# Patient Record
Sex: Male | Born: 1985 | Hispanic: No | Marital: Single | Smoking: Current every day smoker
Health system: Southern US, Community
[De-identification: ages and names within clinical notes are randomized; demographics above are authoritative.]

## PROBLEM LIST (undated history)

## (undated) DIAGNOSIS — R569 Unspecified convulsions: Secondary | ICD-10-CM

## (undated) DIAGNOSIS — S069X9A Unspecified intracranial injury with loss of consciousness of unspecified duration, initial encounter: Secondary | ICD-10-CM

## (undated) DIAGNOSIS — S069XAA Unspecified intracranial injury with loss of consciousness status unknown, initial encounter: Secondary | ICD-10-CM

## (undated) DIAGNOSIS — G40909 Epilepsy, unspecified, not intractable, without status epilepticus: Secondary | ICD-10-CM

## (undated) DIAGNOSIS — F209 Schizophrenia, unspecified: Secondary | ICD-10-CM

## (undated) HISTORY — DX: Epilepsy, unspecified, not intractable, without status epilepticus: G40.909

## (undated) HISTORY — DX: Unspecified intracranial injury with loss of consciousness status unknown, initial encounter: S06.9XAA

## (undated) HISTORY — DX: Schizophrenia, unspecified: F20.9

## (undated) HISTORY — DX: Unspecified intracranial injury with loss of consciousness of unspecified duration, initial encounter: S06.9X9A

## (undated) HISTORY — DX: Unspecified convulsions: R56.9

---

## 2010-03-01 ENCOUNTER — Emergency Department: Payer: Self-pay | Admitting: Emergency Medicine

## 2010-06-06 ENCOUNTER — Encounter: Payer: Self-pay | Admitting: Family Medicine

## 2010-06-06 ENCOUNTER — Ambulatory Visit: Payer: Self-pay | Admitting: Family Medicine

## 2010-06-06 DIAGNOSIS — F209 Schizophrenia, unspecified: Secondary | ICD-10-CM

## 2010-07-24 ENCOUNTER — Ambulatory Visit: Payer: Self-pay | Admitting: Family Medicine

## 2010-07-24 DIAGNOSIS — J189 Pneumonia, unspecified organism: Secondary | ICD-10-CM | POA: Insufficient documentation

## 2010-07-26 ENCOUNTER — Telehealth: Payer: Self-pay | Admitting: Family Medicine

## 2010-07-31 ENCOUNTER — Emergency Department (HOSPITAL_COMMUNITY): Admission: EM | Admit: 2010-07-31 | Discharge: 2010-07-31 | Payer: Self-pay | Admitting: Family Medicine

## 2010-08-03 ENCOUNTER — Encounter: Payer: Self-pay | Admitting: *Deleted

## 2010-08-03 ENCOUNTER — Ambulatory Visit: Payer: Self-pay | Admitting: Family Medicine

## 2010-08-15 ENCOUNTER — Encounter: Payer: Self-pay | Admitting: Family Medicine

## 2010-08-15 ENCOUNTER — Ambulatory Visit: Payer: Self-pay | Admitting: Family Medicine

## 2010-08-15 LAB — CONVERTED CEMR LAB
ALT: 36 units/L (ref 0–53)
Albumin: 4.5 g/dL (ref 3.5–5.2)
Alkaline Phosphatase: 92 units/L (ref 39–117)
Glucose, Bld: 82 mg/dL (ref 70–99)
MCHC: 32.6 g/dL (ref 30.0–36.0)
MCV: 92.6 fL (ref 78.0–100.0)
Platelets: 324 10*3/uL (ref 150–400)
Potassium: 4.3 meq/L (ref 3.5–5.3)
RBC: 5.03 M/uL (ref 4.22–5.81)
RDW: 12.7 % (ref 11.5–15.5)
Sodium: 143 meq/L (ref 135–145)
Total Bilirubin: 0.3 mg/dL (ref 0.3–1.2)
Total Protein: 7.5 g/dL (ref 6.0–8.3)

## 2010-08-16 ENCOUNTER — Telehealth: Payer: Self-pay | Admitting: Family Medicine

## 2010-08-16 ENCOUNTER — Encounter: Payer: Self-pay | Admitting: Family Medicine

## 2010-11-05 ENCOUNTER — Ambulatory Visit: Admission: RE | Admit: 2010-11-05 | Discharge: 2010-11-05 | Payer: Self-pay | Source: Home / Self Care

## 2010-11-05 DIAGNOSIS — Z87820 Personal history of traumatic brain injury: Secondary | ICD-10-CM | POA: Insufficient documentation

## 2010-11-13 NOTE — Progress Notes (Signed)
Summary: refill  Phone Note Refill Request Call back at Home Phone [phone]   Refills Requested: Medication #1:  TYLENOL 325 MG TABS take one to two tabs every 6 hours as needed for pain or fever  Medication #2:  VICKS DAYQUIL MULTI-SYMPTOM 10-5-325 MG CAPS take as directed on package.  needs to be on the as needed list [facility]  Initial call taken by: De Nurse,  July 26, 2010 12:06 PM    New/Updated Medications: VICKS DAYQUIL SINUS 5-325 MG CAPS (PHENYLEPHRINE-ACETAMINOPHEN)  TYLENOL 325 MG TABS (ACETAMINOPHEN) 1-2 tabs every 6 hours as needed for pain

## 2010-11-13 NOTE — Miscellaneous (Signed)
Summary: ROI  ROI   Imported By: Clydell Hakim 06/06/2010 16:54:21  _____________________________________________________________________  External Attachment:    Type:   Image     Comment:   External Document

## 2010-11-13 NOTE — Assessment & Plan Note (Signed)
Summary: follow up from urgent care, see note /ls   Vital Signs:  Patient profile:   25 year old male Height:      63 inches Weight:      134 pounds BMI:     23.82 Temp:     97.7 degrees F oral Pulse rate:   79 / minute BP sitting:   106 / 69  (left arm) Cuff size:   regular  Vitals Entered By: Jimmy Footman, CMA (August 03, 2010 1:57 PM) CC: hospital f/u, productive cough/ fluid on lungs Is Patient Diabetic? No   Primary Care Kathaleen Dudziak:  Renold Don MD  CC:  hospital f/u and productive cough/ fluid on lungs.  History of Present Illness: 1) Pneumonia: Seen on 10/11 with nasal congestion and cough x 3 days w/o fever, dyspnea, nausea, vomiting or diarrhea and with reassuring respiratory exam - diagnosed with URI. Symptoms worsened over the course of the next week - patient developed fever to 101F, worsening non-productive cough, purulent nasal dischare, myalgia, sore throat - seen at Urgent Care on 10/18, CXR showed RML and possible lingular PNA - treated with single dose ceftriaxone and 10 day course of Azithromycin. Also with Guiatuss, APAP as needed. Symptoms have improved somewhat since that evaluation, though still has deep cough. (fever has resolved completely and breathing is back to normal)   Denies: wheeze, rash, nausea, vomiting or diarrhea, lethargy   Med rec as below and also GUAITUSS as needed   Current Medications (verified): 1)  Zonegran 100 Mg Caps (Zonisamide) .... 2 Tabs in Am, 3 Tabs in Pm Daily - Managed By [facility] Neurologist 2)  Keppra 100 Mg/ml Soln (Levetiracetam) .... 300 Mg By Mouth At Bedtime - Managed By [facility] Neurology 3)  Propranolol Hcl 40 Mg Tabs (Propranolol Hcl) .Marland Kitchen.. 1 Tab By Mouth Daily - Provided By Dr. Lerry Liner [facility] Neurology 4)  Latuda 80 Mg Tabs (Lurasidone Hcl) .Marland Kitchen.. 1 Tab By Mouth At Bedtime - Managed By [facility] Neurology 5)  Tylenol 325 Mg Tabs (Acetaminophen) .... Take One To Two Tabs Every 6 Hours As Needed For Pain or Fever 6)   Azithromycin 250 Mg Tabs (Azithromycin) .... One Tab By Mouth Daily X 9 Days  Allergies (verified): No Known Drug Allergies  Physical Exam  General:  Vital signs reviewed. Well-developed, well-nourished patient in NAD.  Awake and cooperative  Head:  normocephalic and atraumatic.   Eyes:  no conjunctivitis  Ears:  R ear normal and L ear normal.   Nose:  cleft palate repaired- nasal deviation.  rhinorrhea and nasal congestion present. Mouth:  Oral mucosa and oropharynx without lesions or exudates.  Neck:  Supple, full range of motion, no goiters or cervical lymphadenopathy noted  Lungs:  clear to auscultation bilaterally without wheezing, rales, or rhonchi.  Normal work of breathing  Heart:  Regular rate and rhythm without murmur, rub, or gallop.  Normal S1/S2  Abdomen:  soft/nondistended/nontender.  Bowel sounds present and normoactive.  No organomegaly noted.     Impression & Recommendations:  Problem # 1:  PNEUMONIA, RIGHT MIDDLE LOBE (ICD-486) Assessment New  Symptoms improving. Continue azithro as below. Patient s/op ceftriaxone x 1 dose at Urgent Care as well. Symptomatic treatment and red flags reviewed as well. Follow up with PCP as scheduled.   His updated medication list for this problem includes:    Azithromycin 250 Mg Tabs (Azithromycin) ..... One tab by mouth daily x 9 days  Orders: [facility]- Est Level  3 ([address])  Complete Medication List: 1)  Zonegran 100 Mg Caps (Zonisamide) .... 2 tabs in am, 3 tabs in pm daily - managed by [facility] neurologist 2)  Keppra 100 Mg/ml Soln (Levetiracetam) .... 300 mg by mouth at bedtime - managed by [facility] neurology 3)  Propranolol Hcl 40 Mg Tabs (Propranolol hcl) .Marland Kitchen.. 1 tab by mouth daily - provided [facility] 4)  Latuda 80 Mg Tabs (Lurasidone hcl) .Marland Kitchen.. 1 tab by mouth at bedtime - managed by [facility] neurology 5)  Tylenol 325 Mg Tabs (Acetaminophen) .... Take one to two tabs every 6 hours as needed for  pain or fever 6)  Azithromycin 250 Mg Tabs (Azithromycin) .... One tab by mouth daily x 9 days   Orders Added: 1)  [facility]- Est Level  3 [[address]]

## 2010-11-13 NOTE — Assessment & Plan Note (Signed)
Summary: CHEST CONGESTION X [facility]   Vital Signs:  Patient profile:   25 year old male Weight:      136.8 pounds Temp:     99.1 degrees F oral Pulse rate:   78 / minute Pulse rhythm:   regular BP sitting:   106 / 68  (left arm) Cuff size:   regular  Vitals Entered By: Loralee Pacas CMA (July 24, 2010 10:28 AM) CC: chest congestion   Primary Care Hawkins Seaman:  Renold Don MD  CC:  chest congestion.  History of Present Illness: 25 yo here for workin.  patient lives in a group home and they need a prescription in order to administer an medications  has had nasal congestion for 3 days as his most bothersome symptom.  Also with "scratchy throat", cough.  No fever, dyspnea, ear pain, n/v/d.   Habits & Providers  Alcohol-Tobacco-Diet     Tobacco Status: current     Tobacco Counseling: to quit use of tobacco products     Cigarette Packs/Day: 1.0  Current Medications (verified): 1)  Zonegran 100 Mg Caps (Zonisamide) .... 2 Tabs in Am, 3 Tabs in Pm Daily - Managed By [facility] Neurologist 2)  Keppra 100 Mg/ml Soln (Levetiracetam) .... 300 Mg By Mouth At Bedtime - Managed By [facility] Neurology 3)  Propranolol Hcl 40 Mg Tabs (Propranolol Hcl) .Marland Kitchen.. 1 Tab By Mouth Daily - Provided By Dr. Lerry Liner [facility] Neurology 4)  Latuda 80 Mg Tabs (Lurasidone Hcl) .Marland Kitchen.. 1 Tab By Mouth At Bedtime - Managed By [facility] Neurology 5)  Tylenol 325 Mg Tabs (Acetaminophen) .... Take One To Two Tabs Every 6 Hours As Needed For Pain or Fever 6)  Vicks Dayquil Multi-Symptom 10-5-325 Mg Caps (Dm-Phenylephrine-Acetaminophen) .... Take As Directed On Package  Allergies: No Known Drug Allergies PMH-FH-SH reviewed for relevance  Review of Systems      See HPI  Physical Exam  General:  Vital signs reviewed. Well-developed, well-nourished patient in NAD.  Awake and cooperative  Nose:  cleft palate repaired- nasal deviation.  rhinorrhea and nasal congestion present. Mouth:  Oral mucosa and  oropharynx without lesions or exudates.  Lungs:  clear to auscultation bilaterally without wheezing, rales, or rhonchi.  Normal work of breathing  Heart:  Regular rate and rhythm without murmur, rub, or gallop.  Normal S1/S2    Impression & Recommendations:  Problem # 1:  VIRAL URI (ICD-465.9) URI- symptomatic treatment.  Placed OTC meds on his medication list temporatily so he can be administered these in the group home.  Given handout on care of cough and cold, nasal saline for nasal congestion.  His updated medication list for this problem includes:    Tylenol 325 Mg Tabs (Acetaminophen) .Marland Kitchen... Take one to two tabs every 6 hours as needed for pain or fever    Vicks Dayquil Multi-symptom 10-5-325 Mg Caps (Dm-phenylephrine-acetaminophen) .Marland Kitchen... Take as directed on package  Complete Medication List: 1)  Zonegran 100 Mg Caps (Zonisamide) .... 2 tabs in am, 3 tabs in pm daily - managed by [facility] neurologist 2)  Keppra 100 Mg/ml Soln (Levetiracetam) .... 300 mg by mouth at bedtime - managed by [facility] neurology 3)  Propranolol Hcl 40 Mg Tabs (Propranolol hcl) .Marland Kitchen.. 1 tab by mouth daily - provided [facility] 4)  Latuda 80 Mg Tabs (Lurasidone hcl) .Marland Kitchen.. 1 tab by mouth at bedtime - managed by [facility] neurology 5)  Tylenol 325 Mg Tabs (Acetaminophen) .... Take one to  two tabs every 6 hours as needed for pain or fever 6)  Vicks Dayquil Multi-symptom 10-5-325 Mg Caps (Dm-phenylephrine-acetaminophen) .... Take as directed on package  Patient Instructions: 1)  See handout on cough and cold 2)  Return if not getting better in 1 week or start running fever 101.  Cough though may last even after youre better. Prescriptions: TYLENOL 325 MG TABS (ACETAMINOPHEN) take one to two tabs every 6 hours as needed for pain or fever  #30 x 0   Entered and Authorized by:   Delbert Harness MD   Signed by:   Delbert Harness MD on 07/24/2010   Method used:   Historical   RxID:    [id number]
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
## 2010-11-13 NOTE — Miscellaneous (Signed)
Summary: triage call  Clinical Lists Changes   received call from  group home caregiver and he states patient was seen in urgent care 07/31/2010 and was advised he had fluid in both lungs, was  given antibiotic injection and started on oral antibiotic and cough med. was told to follow up with PCP in 48 hours . appointment schedule today for recheck. Theresia Lo RN  August 03, 2010 11:15 AM

## 2010-11-13 NOTE — Letter (Signed)
Summary: Results Follow-up Letter  [facility]  [address]   [place], [place] [address]   Phone: [phone]  Fax: [phone]    08/16/2010  [address] [place], [place]  [address]  Dear Donald Novak,   All of your lab tests have returned and are completely normal.  We did not find any reason that you should be having increased seizures as far as liver or kidney function is concerned.  I still recommend you contact your Neurologist for further recommendations.  The following are the results of your recent test(s):    Sodium                    143 mEq/L                   135-145   Potassium                 4.3 mEq/L                   3.5-5.3   Chloride                  109 mEq/L                   96-112   CO2                       24 mEq/L                    19-32   Glucose                   82 mg/dL                    57-84   BUN                       15 mg/dL                    6-96   Creatinine                1.11 mg/dL                  0.40-1.50   Bilirubin, Total          0.3 mg/dL                   2.9-5.2   Alkaline Phosphatase      92 U/L                      39-117   AST/SGOT                  20 U/L                      0-37   ALT/SGPT                  36 U/L                      0-53   Total Protein             7.5 g/dL                    8.4-1.3   Albumin  4.5 g/dL                    1.6-1.0   Calcium                   9.5 mg/dL                   9.6-04.5 ! Est GFR, African American                             >60 mL/min                  >60 ! Est GFR, NonAfrican American                             >60 mL/min                  >60  Tests: (2) CBC NO Diff (Complete Blood Count) (10000)   WBC                       5.4 K/uL                    4.0-10.5   RBC                       5.03 MIL/uL                 4.22-5.81   Hemoglobin                15.2 g/dL                   40.9-81.1   Hematocrit                46.6 %                       39.0-52.0   MCV                       92.6 fL                     78.0-100.0 ! MCH                       30.2 pg                     26.0-34.0   MCHC                      32.6 g/dL                   91.4-78.2   RDW                       12.7 %                      11.5-15.5   Platelet Count            324 K/uL                    150-400   _________________________________________________________  Sincerely,  Renold Don MD [facility]  Appended Document: Results Follow-up Letter mailed

## 2010-11-13 NOTE — Progress Notes (Signed)
Summary: phn msg  Phone Note Call from Patient Call back at [phone]   Caller: Lucia Gaskins Summary of Call: wants Dr Gwendolyn Grant to call Dr Marcello Moores - neurology about his meds -pls call asap  Initial call taken by: De Nurse,  August 16, 2010 2:27 PM  Follow-up for Phone Call        I discussed with them at the visit that caretaker for patient needs to call and make appointment with Dr. Marcello Moores, patient's neurologist.  He needs to be seen by Dr. Marcello Moores asap, not by me.  I saw him in clinic, ran lab tests, which all came back normal, but if any more specific tests need to be done, they need to be done by and uder the supervision of Dr. Marcello Moores.   Follow-up by: Renold Don MD,  August 16, 2010 5:02 PM  Additional Follow-up for Phone Call Additional follow up Details #1::        called and gave Mr Amie Critchley message.  he wanted Dr Gwendolyn Grant to talk to Dr Marcello Moores because Marcello Moores wants to up the meds for him for his seizures - he can't talk to Dr France Ravens office b/c he doesn't have the authority to talk to him.  He wants Dr Gwendolyn Grant to monitor his meds instead.   Dr Marcello Moores # is -[phone] Additional Follow-up by: De Nurse,  August 21, 2010 11:13 AM    Additional Follow-up for Phone Call Additional follow up Details #2::    Called and spoke with Caretaker regarding this.  Arvine has appt on 11/22 for a follow up for his seizure medications.  Patint is to keep this appt.   Follow-up by: Renold Don MD,  August 21, 2010 12:38 PM

## 2010-11-13 NOTE — Letter (Signed)
Summary: Generic Letter  [facility]  [address]   [place], [place] [address]   Phone: [phone]  Fax: [phone]    08/03/2010   To whom it may concern:  Donald Novak was seen in the office office today due to medical reasons. If you have any questions please feel free to call our office.    Sincerely,   Jimmy Footman, CMA
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
## 2010-11-15 NOTE — Assessment & Plan Note (Signed)
Summary: f/u eo   Vital Signs:  Patient profile:   25 year old male Height:      63 inches Weight:      143 pounds Temp:     98.4 degrees F oral Pulse rate:   57 / minute Pulse rhythm:   regular BP sitting:   97 / 61  (left arm) Cuff size:   regular CC: follow-up visit Is Patient Diabetic? No   Primary Care Provider:  Renold Don MD  CC:  follow-up visit.  History of Present Illness: 1.  FU seizure disorder:  Patient doing well since last visit.  No further seizures.  Seen by neurologist who increased Keppra and added Tenex to medication regimen.  Patient also has VNS and new magnet for stimulator has been ordered.    No other concerns per caretaker or patient.  He has otherwise been well.      Habits & Providers  Alcohol-Tobacco-Diet     Tobacco Status: current     Tobacco Counseling: to quit use of tobacco products     Cigarette Packs/Day: 1.0  Exercise-Depression-Behavior     Seat Belt Use: always  Current Problems (verified): 1)  Personal History of Traumatic Brain Injury  (ICD-V15.52) 2)  Pneumonia, Right Middle Lobe  (ICD-486) 3)  Schizophrenia  (ICD-295.90) 4)  Seizures, Hx of  (ICD-V12.49)  Current Medications (verified): 1)  Zonegran 100 Mg Caps (Zonisamide) .... 2 Tabs in Am, 3 Tabs in Pm Daily - Managed By [facility] Neurologist 2)  Keppra 500 Mg Tabs (Levetiracetam) 3)  Propranolol Hcl 40 Mg Tabs (Propranolol Hcl) .Marland Kitchen.. 1 Tab By Mouth Daily - Provided By Dr. Lerry Liner [facility] Neurology 4)  Latuda 80 Mg Tabs (Lurasidone Hcl) .Marland Kitchen.. 1 Tab By Mouth At Bedtime - Managed By [facility] Neurology 5)  Tylenol 325 Mg Tabs (Acetaminophen) .... Take One To Two Tabs Every 6 Hours As Needed For Pain or Fever 6)  Tenex 1 Mg Tabs (Guanfacine Hcl) .... Provided By Neurologist  Allergies (verified): No Known Drug Allergies  Social History: Risk analyst Use:  always  Review of Systems       ROS:  no headaches, pre-syncopal or syncopal episodes, chest pain, palpitations,  shortness of breath or dyspnea, abdominal pain, diarrhea or constipation, melena, hematochezia, lower extremity swelling.    Physical Exam  General:  Vital signs reviewed. Well-developed, well-nourished patient in NAD.  Awake and cooperative  Eyes:  PERRL.  EOMI.   Lungs:  clear to auscultation bilaterally without wheezing, rales, or rhonchi.  Normal work of breathing  Heart:  Regular rate and rhythm without murmur, rub, or gallop.  Normal S1/S2  Neurologic:  CN II-XII intact.  Sensation intact throughout.  Patient with decreased Left sided strength at baseline both upper and lower extremities, 4/5.     Impression & Recommendations:  Problem # 1:  SEIZURES, HX OF (ICD-V12.49) Assessment Improved Managed by neurologist.  New medications added to list.  Currently stable.   Orders: [facility]- Est Level  3 (11914)  Problem # 2:  SCHIZOPHRENIA (ICD-295.90) Assessment: Comment Only Currently stable.    Complete Medication List: 1)  Zonegran 100 Mg Caps (Zonisamide) .... 2 tabs in am, 3 tabs in pm daily - managed by [facility] neurologist 2)  Keppra 500 Mg Tabs (Levetiracetam) 3)  Propranolol Hcl 40 Mg Tabs (Propranolol hcl) .Marland Kitchen.. 1 tab by mouth daily - provided [facility] 4)  Latuda 80 Mg Tabs (Lurasidone hcl) .Marland Kitchen.. 1 tab by  mouth at bedtime - managed by [facility] neurology 5)  Tylenol 325 Mg Tabs (Acetaminophen) .... Take one to two tabs every 6 hours as needed for pain or fever 6)  Tenex 1 Mg Tabs (Guanfacine hcl) .... Provided by neurologist   Orders Added: 1)  [facility]- Est Level  3 [16109]

## 2010-11-23 ENCOUNTER — Encounter: Payer: Self-pay | Admitting: *Deleted

## 2010-11-27 ENCOUNTER — Ambulatory Visit (INDEPENDENT_AMBULATORY_CARE_PROVIDER_SITE_OTHER): Payer: Medicaid Other | Admitting: Family Medicine

## 2010-11-27 DIAGNOSIS — F209 Schizophrenia, unspecified: Secondary | ICD-10-CM

## 2010-11-27 DIAGNOSIS — G40909 Epilepsy, unspecified, not intractable, without status epilepticus: Secondary | ICD-10-CM

## 2010-11-27 NOTE — Patient Instructions (Signed)
Thanks for coming in today.  I do not think that drug levels today will help much.  See your neurologist.  Make an appointment with your psychiatrist to establish captaincy.  See. Dr Gwendolyn Grant (here) after your neurologist visit.
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
## 2010-11-27 NOTE — Assessment & Plan Note (Signed)
Advised to see psych to eval for POA and capacity and competency.  Will f/u with Dr. Gwendolyn Grant to ensure that this is being done.

## 2010-11-27 NOTE — Assessment & Plan Note (Signed)
New seizure last week.  Had seizure  Last about 1.5 months ago.  He needs a f/u visit with neuro. March 9th will be OK.  No new neuro exam changes to my exam today.  No new meds. I do not think a drug level will be helpful today.  Plan: F/u after neuro visit. Gave red flags.

## 2011-01-17 ENCOUNTER — Ambulatory Visit: Payer: Medicaid Other | Admitting: Family Medicine

## 2011-03-25 ENCOUNTER — Ambulatory Visit: Payer: Medicaid Other | Admitting: Family Medicine

## 2011-05-07 ENCOUNTER — Encounter: Payer: Self-pay | Admitting: Family Medicine

## 2011-05-07 ENCOUNTER — Ambulatory Visit (INDEPENDENT_AMBULATORY_CARE_PROVIDER_SITE_OTHER): Payer: Medicaid Other | Admitting: Family Medicine

## 2011-05-07 VITALS — BP 107/74 | HR 63 | Temp 97.7°F | Ht 63.0 in | Wt 178.5 lb

## 2011-05-07 DIAGNOSIS — Z87820 Personal history of traumatic brain injury: Secondary | ICD-10-CM

## 2011-05-07 NOTE — Patient Instructions (Signed)
Donald Novak was seen in my clinic today.  He is doing well and I will see him back in 4-6 months.  If you have questions please call our office.

## 2011-05-12 ENCOUNTER — Encounter: Payer: Self-pay | Admitting: Family Medicine

## 2011-05-12 NOTE — Assessment & Plan Note (Signed)
No changes to mental status. I have suggested that Donald Novak can spread out his visits but his group home manager insists on bringing him every 3 months to be checked out.   Psychiatrist manages all of his medications.  Will continue to provide physical exam and review of symptoms upon request.

## 2011-05-12 NOTE — Progress Notes (Signed)
  Subjective:    Patient ID: Donald Novak, male    DOB: 09/06/1986, 25 y.o.   MRN: [id number]  HPI Donald Novak is a 25 yo Male who is a resident of [facility] group home who returns today for regular 3 month "check-up."  No changes to his medications or any new complaints.  He suffers from TBI, seizure disorder, and schizophrenia.  His medications are managed by a psychiatrist in [place].  No halluncinations, seizures, changes in personality.     Review of Systems The patient denies fever, unusual weight change, decreased hearing, chest pain, palpitations, pre-syncopal or syncopal episodes, dyspnea on exertion, prolonged cough, hemoptysis, change in bowel habits, melena, hematochezia, severe indigestion/heartburn, nausea/vomiting/abdominal pain, genital sores, muscle weakness, difficulty walking, abnormal bleeding, or enlarged lymph nodes.      Objective:   Physical Exam Gen:  Alert, cooperative patient who appears stated age in no acute distress.  Vital signs reviewed. HEENT:  [place]/AT.  EOMI, PERRL.  MMM, tonsils non-erythematous, non-edematous.  External ears WNL, Bilateral TM's normal without retraction, redness or bulging.  Neck: No masses or thyromegaly or limitation in range of motion.  No cervical lymphadenopathy. Cardiac:  Regular rate and rhythm without murmur auscultated.  Good S1/S2. Pulm:  Clear to auscultation bilaterally with good air movement.  No wheezes or rales noted.   Abd:  Soft/nondistended/nontender.  Good bowel sounds throughout all four quadrants.  No masses noted.  Ext:  No clubbing/cyanosis/erythema.  No edema noted bilateral lower extremities.   Alert and oriented to person, place, and date.  CN II-XII intact.  No focal deficits noted.         Assessment & Plan:   No problem-specific assessment & plan notes found for this encounter.

## 2011-05-14 ENCOUNTER — Other Ambulatory Visit: Payer: Self-pay | Admitting: Family Medicine

## 2011-05-14 MED ORDER — ZONISAMIDE 100 MG PO CAPS: 100.0000 mg | ORAL_CAPSULE | Freq: Every day | ORAL | Status: DC

## 2011-05-14 MED ORDER — PROPRANOLOL HCL 40 MG PO TABS: ORAL_TABLET | ORAL | Status: DC

## 2011-08-28 ENCOUNTER — Ambulatory Visit (INDEPENDENT_AMBULATORY_CARE_PROVIDER_SITE_OTHER): Payer: Medicaid Other | Admitting: Family Medicine

## 2011-08-28 ENCOUNTER — Encounter: Payer: Self-pay | Admitting: Family Medicine

## 2011-08-28 DIAGNOSIS — M216X9 Other acquired deformities of unspecified foot: Secondary | ICD-10-CM

## 2011-08-28 DIAGNOSIS — M549 Dorsalgia, unspecified: Secondary | ICD-10-CM

## 2011-08-28 DIAGNOSIS — M21379 Foot drop, unspecified foot: Secondary | ICD-10-CM | POA: Insufficient documentation

## 2011-08-28 MED ORDER — IBUPROFEN 200 MG PO TABS: 400.0000 mg | ORAL_TABLET | Freq: Four times a day (QID) | ORAL | Status: DC | PRN

## 2011-08-28 NOTE — Assessment & Plan Note (Signed)
AFO brace. Patient already seen at [facility] medical supply. Evidently his psychiatrist has provided him a prescription for ortho device. Recommend to continue use of this after it is properly fitted

## 2011-08-28 NOTE — Progress Notes (Signed)
  Subjective:    Patient ID: Donald Novak, male    DOB: 23-Feb-1986, 25 y.o.   MRN: [id number]  HPI 25 yo M here for 3 month check-up.  I have discussed with caregiver that Donald Novak needs to be seen every 6-12 months as he is currently stable from a medical perspective and his bipolar medication, traumatic brain injury, and seizure disorder handled by both the neurologist and psychiatrist. However his caretaker feels more comfortable having the patient seen every 3 months. Current complaints for today are listed below:  1.  Foot problem:  Abnormal gait. This is a long-term problem for patient. He's limped since at least age 35. Unclear etiology of the problem. Neuro foot pain. No paresthesias or numbness. States that he wore an AFO brace for the past but this has been rubbing on his leg and therefore he does know where this. He has been seen by different medical supply and has been told he can be fitted for a new brace. He should like to go forward with this  2.  Back pain:  Present for the past week or so. Describes midthoracic pain in the "middle of my back". Has taken Tylenol with some relief. No fevers or chills. No headaches. No incontinence of bowel or bladder.  Review of Systems See HPI above for review of systems.       Objective:   Physical Exam Gen:  Alert, cooperative patient who appears stated age in no acute distress.  Vital signs reviewed. Cardiac:  Regular rate and rhythm without murmur auscultated.  Good S1/S2. Pulm:  Clear to auscultation bilaterally with good air movement.  No wheezes or rales noted.   MSK:  SLR positive for lower back pain but only mild pain. Probable lumbar spasm noted left thoracic area. Extremity: Right foot drop noted on exam. Patient 2/5 strength plantar flexion 1/5 strength dorsiflexion. Neuro:  Wide swinging gait consistent with foot drop.       Assessment & Plan:

## 2011-08-28 NOTE — Assessment & Plan Note (Signed)
Secondary to uneven gait. Ibuprofen for relief.   Heat and massage therapy.

## 2011-12-16 ENCOUNTER — Ambulatory Visit: Payer: Medicaid Other | Admitting: Family Medicine

## 2011-12-30 ENCOUNTER — Ambulatory Visit: Payer: Medicaid Other | Admitting: Family Medicine

## 2012-01-06 ENCOUNTER — Ambulatory Visit: Payer: Medicaid Other | Admitting: Family Medicine

## 2012-03-31 ENCOUNTER — Ambulatory Visit: Payer: Medicaid Other | Admitting: Family Medicine

## 2012-04-10 ENCOUNTER — Ambulatory Visit: Payer: Medicaid Other | Admitting: Family Medicine

## 2012-04-30 ENCOUNTER — Encounter: Payer: Self-pay | Admitting: Family Medicine

## 2012-04-30 ENCOUNTER — Ambulatory Visit (INDEPENDENT_AMBULATORY_CARE_PROVIDER_SITE_OTHER): Payer: Medicaid Other | Admitting: Family Medicine

## 2012-04-30 ENCOUNTER — Other Ambulatory Visit (HOSPITAL_COMMUNITY)
Admission: RE | Admit: 2012-04-30 | Discharge: 2012-04-30 | Disposition: A | Discharge status: Home / Self Care | Payer: Medicaid Other | Source: Ambulatory Visit | Attending: Family Medicine | Admitting: Family Medicine

## 2012-04-30 VITALS — BP 105/72 | HR 57 | Temp 97.6°F | Ht 63.0 in | Wt 139.3 lb

## 2012-04-30 DIAGNOSIS — Z202 Contact with and (suspected) exposure to infections with a predominantly sexual mode of transmission: Secondary | ICD-10-CM

## 2012-04-30 DIAGNOSIS — Z2089 Contact with and (suspected) exposure to other communicable diseases: Secondary | ICD-10-CM

## 2012-04-30 DIAGNOSIS — Z113 Encounter for screening for infections with a predominantly sexual mode of transmission: Secondary | ICD-10-CM | POA: Insufficient documentation

## 2012-04-30 DIAGNOSIS — Z8669 Personal history of other diseases of the nervous system and sense organs: Secondary | ICD-10-CM

## 2012-04-30 DIAGNOSIS — F209 Schizophrenia, unspecified: Secondary | ICD-10-CM

## 2012-04-30 DIAGNOSIS — Z20828 Contact with and (suspected) exposure to other viral communicable diseases: Secondary | ICD-10-CM

## 2012-04-30 NOTE — Assessment & Plan Note (Signed)
Patient expressed desire for STD screening due to multiple sexual partners.  Will check RPR, HIV antibody, GC, and Chlamydia today and call with results.  Needs to be educated on safe sex practices.

## 2012-04-30 NOTE — Assessment & Plan Note (Signed)
Patient seems to be doing well.  Has enrolled in school.  No reported hallucinations. No changes in behavior.  Patient will continue current medications per Psychiatry.

## 2012-04-30 NOTE — Progress Notes (Signed)
Subjective:     Patient ID: Donald Novak, male   DOB: [birth year], 26 y.o.   MRN: [id number]  HPI 26 yo M here for followup and FL2 paperwork. He resides in a group home and is accompanied by his caregiver [name].  He has a PMH of bipolar disorder, traumatic brain injury, seizures, and schizophrenia.  He continues to be closely followed by his neurologist and psychiatrist.  Recent seizure activity in May 2013 - managed by neurology.    Today he has no problems or complaints.  He did however express that he STD testing today.  He reports 2-3 sexual partners and wants to be tested.  He did not report any discharge or problems with urination.  [name] and [place] report no new changes to medications.  [name] reports that Kingson recently enrolled at [facility] and is doing well.    Review of Systems Denies headache, nausea, vomiting. No chest pain, SOB.    Objective:   Physical Exam General:  Alert and oriented. NAD.  Appears stated age.  Flat affect. Heart:  RRR. No murmurs, rubs, or gallops. Chest:  CTAB.  No rales, rhonchi, or wheeze.  Abdomen:  Soft, nontender, nondistended. Extremities:  No cyanosis, clubbing, or edema.  Right upper extremity noticeably weak compared with the left.  Muscle strength 4/5 in right, 5/5 in left.    Assessment:         Plan:

## 2012-04-30 NOTE — Patient Instructions (Addendum)
It was nice to meet you today.  I will call with your test results.  If you don't hear from me in 1-2 weeks call the office.  Continue all of your current medications.    I will see you back for follow up in 3-6 months.

## 2012-04-30 NOTE — Assessment & Plan Note (Signed)
No seizure activity since May 2013.   Seizure meds per Neurologist.

## 2012-05-01 LAB — HIV ANTIBODY (ROUTINE TESTING W REFLEX): HIV: NONREACTIVE

## 2012-05-01 LAB — RPR

## 2012-05-05 ENCOUNTER — Telehealth: Payer: Self-pay | Admitting: Family Medicine

## 2012-05-05 NOTE — Telephone Encounter (Signed)
Called patient to give lab results 7/23 at 345 pm.  No answer. Left instructions to call clinic.  Will attempt to call again tomorrow.

## 2012-05-12 ENCOUNTER — Encounter: Payer: Self-pay | Admitting: Family Medicine

## 2012-05-21 ENCOUNTER — Encounter: Payer: Self-pay | Admitting: Family Medicine

## 2012-05-23 ENCOUNTER — Encounter (HOSPITAL_COMMUNITY): Payer: Self-pay | Admitting: *Deleted

## 2012-05-23 ENCOUNTER — Emergency Department (HOSPITAL_COMMUNITY)
Admission: EM | Admit: 2012-05-23 | Discharge: 2012-05-24 | Disposition: A | Discharge status: Home / Self Care | Payer: Medicaid Other | Attending: Emergency Medicine | Admitting: Emergency Medicine

## 2012-05-23 ENCOUNTER — Emergency Department (HOSPITAL_COMMUNITY): Payer: Medicaid Other

## 2012-05-23 DIAGNOSIS — R10819 Abdominal tenderness, unspecified site: Secondary | ICD-10-CM | POA: Insufficient documentation

## 2012-05-23 DIAGNOSIS — G40909 Epilepsy, unspecified, not intractable, without status epilepticus: Secondary | ICD-10-CM | POA: Insufficient documentation

## 2012-05-23 DIAGNOSIS — Z79899 Other long term (current) drug therapy: Secondary | ICD-10-CM | POA: Insufficient documentation

## 2012-05-23 DIAGNOSIS — R109 Unspecified abdominal pain: Secondary | ICD-10-CM | POA: Insufficient documentation

## 2012-05-23 DIAGNOSIS — Z87820 Personal history of traumatic brain injury: Secondary | ICD-10-CM | POA: Insufficient documentation

## 2012-05-23 DIAGNOSIS — N201 Calculus of ureter: Secondary | ICD-10-CM | POA: Insufficient documentation

## 2012-05-23 LAB — COMPREHENSIVE METABOLIC PANEL
ALT: 42 U/L (ref 0–53)
Albumin: 5.1 g/dL (ref 3.5–5.2)
Calcium: 9.9 mg/dL (ref 8.4–10.5)
GFR calc Af Amer: >90 mL/min (ref >90)
Glucose, Bld: 107 mg/dL — ABNORMAL HIGH (ref 70–99)
Sodium: 140 mEq/L (ref 135–145)
Total Protein: 8.7 g/dL — ABNORMAL HIGH (ref 6.0–8.3)

## 2012-05-23 LAB — URINALYSIS, ROUTINE W REFLEX MICROSCOPIC
Ketones, ur: NEGATIVE mg/dL
Nitrite: NEGATIVE
Protein, ur: NEGATIVE mg/dL
Urobilinogen, UA: 1 mg/dL (ref 0.0–1.0)

## 2012-05-23 LAB — CBC WITH DIFFERENTIAL/PLATELET
Basophils Relative: 0 % (ref 0–1)
Eosinophils Absolute: 0.9 10*3/uL — ABNORMAL HIGH (ref 0.0–0.7)
Eosinophils Relative: 5 % (ref 0–5)
Lymphs Abs: 2.5 10*3/uL (ref 0.7–4.0)
MCH: 31.3 pg (ref 26.0–34.0)
MCHC: 35.9 g/dL (ref 30.0–36.0)
MCV: 87.1 fL (ref 78.0–100.0)
Neutrophils Relative %: 73 % (ref 43–77)
Platelets: 274 10*3/uL (ref 150–400)
RBC: 5.72 MIL/uL (ref 4.22–5.81)
RDW: 12.1 % (ref 11.5–15.5)

## 2012-05-23 LAB — URINE MICROSCOPIC-ADD ON

## 2012-05-23 MED ORDER — ONDANSETRON 4 MG PO TBDP
4.0000 mg | ORAL_TABLET | Freq: Once | ORAL | Status: AC
  Administered 2012-05-23: 4 mg via ORAL

## 2012-05-23 MED ORDER — ONDANSETRON HCL 4 MG/2ML IJ SOLN
INTRAMUSCULAR | Status: AC
  Filled 2012-05-23: qty 2

## 2012-05-23 MED ORDER — ONDANSETRON 4 MG PO TBDP
ORAL_TABLET | ORAL | Status: AC
  Administered 2012-05-23: 4 mg via ORAL
  Filled 2012-05-23: qty 1

## 2012-05-23 MED ORDER — SODIUM CHLORIDE 0.9 % IV BOLUS (SEPSIS)
1000.0000 mL | Freq: Once | INTRAVENOUS | Status: AC
  Administered 2012-05-23: 1000 mL via INTRAVENOUS

## 2012-05-23 NOTE — ED Provider Notes (Addendum)
History     CSN: [id number]  Arrival date & time 05/23/12  2015   First MD Initiated Contact with Patient 05/23/12 2253      Chief Complaint  Patient presents with  . Abdominal Pain    (Consider location/radiation/quality/duration/timing/severity/associated sxs/prior treatment) HPI HX per PT. onset today of abdominal pain lower abdomen mostly left-sided, sharp in quality and not radiating. Has associated nausea vomiting diarrhea. Denies any fevers or chills. No trauma. No history of same. Has had some dark urine but did not notice blood in urine. Lives in a group home and has history of schizophrenia and traumatic brain injury. No reported seizure activity. pain was severe but now is mild to moderate. Past Medical History  Diagnosis Date  . Schizophrenia   . TBI (traumatic brain injury)   . Seizure disorder   . Seizures     History reviewed. No pertinent past surgical history.  No family history on file.  History  Substance Use Topics  . Smoking status: Current Everyday Smoker -- 0.5 packs/day for 10 years    Types: Cigarettes  . Smokeless tobacco: Not on file  . Alcohol Use: No      Review of Systems  Constitutional: Negative for fever and chills.  HENT: Negative for neck pain and neck stiffness.   Eyes: Negative for pain.  Respiratory: Negative for shortness of breath.   Cardiovascular: Negative for chest pain.  Gastrointestinal: Positive for nausea, vomiting, abdominal pain and diarrhea.  Genitourinary: Negative for dysuria.  Musculoskeletal: Negative for back pain.  Skin: Negative for rash.  Neurological: Negative for headaches.  All other systems reviewed and are negative.    Allergies  Review of patient's allergies indicates no known allergies.  Home Medications   Current Outpatient Rx  Name Route Sig Dispense Refill  . ACETAMINOPHEN 325 MG PO TABS Oral Take 325-650 mg by mouth every 6 (six) hours as needed. For pain/fever    . GUANFACINE HCL 1 MG  PO TABS Oral Take 1 mg by mouth 2 (two) times daily. Provided by neurologist    . IBUPROFEN 200 MG PO TABS Oral Take 400 mg by mouth every 6 (six) hours as needed. For pain    . LEVETIRACETAM 500 MG PO TABS Oral Take 2,500 mg by mouth at bedtime. NO INSTRUCTIONS GIVEN    . LURASIDONE HCL 80 MG PO TABS Oral Take 1 tablet by mouth at bedtime. Managed by [facility]rology    . PROPRANOLOL HCL 40 MG PO TABS Oral Take 120 mg by mouth daily.    Marland Kitchen ZONISAMIDE 100 MG PO CAPS Oral Take 200-300 mg by mouth 2 (two) times daily. 2 tabs in AM, 3 tabs in PM daily - managed by [facility] Meurologist - 500 mg total      BP 132/98  Pulse 48  Temp 97.9 F (36.6 C) (Oral)  Resp 20  SpO2 100%  Physical Exam  Constitutional: He is oriented to person, place, and time. He appears well-developed and well-nourished.  HENT:  Head: Normocephalic and atraumatic.  Eyes: Conjunctivae and EOM are normal. Pupils are equal, round, and reactive to light.  Neck: Trachea normal. Neck supple. No thyromegaly present.  Cardiovascular: Normal rate, regular rhythm, S1 normal, S2 normal and normal pulses.     No systolic murmur is present   No diastolic murmur is present  Pulses:      Radial pulses are 2+ on the right side, and 2+ on the left side.  Pulmonary/Chest: Effort  normal and breath sounds normal. He has no wheezes. He has no rhonchi. He has no rales. He exhibits no tenderness.  Abdominal: Soft. Normal appearance and bowel sounds are normal. He exhibits no mass. There is no CVA tenderness and negative Murphy's sign.       Tender left flank and left lower quadrant without rebound guarding or peritonitis  Musculoskeletal:       BLE:s Calves nontender, no cords or erythema, negative Homans sign  Neurological: He is alert and oriented to person, place, and time. He has normal strength. No cranial nerve deficit or sensory deficit. GCS eye subscore is 4. GCS verbal subscore is 5. GCS motor subscore is 6.  Skin: Skin is  warm and dry. No rash noted. He is not diaphoretic.  Psychiatric: His speech is normal.       Cooperative and appropriate    ED Course  Angiocath insertion Date/Time: 05/23/2012 11:19 PM Performed by: Sunnie Nielsen Authorized by: Sunnie Nielsen Consent: Verbal consent obtained. Risks and benefits: risks, benefits and alternatives were discussed Consent given by: patient Patient understanding: patient states understanding of the procedure being performed Patient consent: the patient's understanding of the procedure matches consent given Procedure consent: procedure consent matches procedure scheduled Required items: required blood products, implants, devices, and special equipment available Patient identity confirmed: verbally with patient Time out: Immediately prior to procedure a "time out" was called to verify the correct patient, procedure, equipment, support staff and site/side marked as required. Preparation: Patient was prepped and draped in the usual sterile fashion. Patient tolerance: Patient tolerated the procedure well with no immediate complications. Comments: 20 G angiocath inserted R EJ without difficulty.  Blood drew and and andback and saline injected.    (including critical care time)  Results for orders placed during the hospital encounter of 05/23/12  URINALYSIS, ROUTINE W REFLEX MICROSCOPIC      Component Value Range   Color, Urine AMBER (*) YELLOW   APPearance CLOUDY (*) CLEAR   Specific Gravity, Urine 1.030  1.005 - 1.030   pH 5.5  5.0 - 8.0   Glucose, UA NEGATIVE  NEGATIVE mg/dL   Hgb urine dipstick LARGE (*) NEGATIVE   Bilirubin Urine SMALL (*) NEGATIVE   Ketones, ur NEGATIVE  NEGATIVE mg/dL   Protein, ur NEGATIVE  NEGATIVE mg/dL   Urobilinogen, UA 1.0  0.0 - 1.0 mg/dL   Nitrite NEGATIVE  NEGATIVE   Leukocytes, UA NEGATIVE  NEGATIVE  CBC WITH DIFFERENTIAL      Component Value Range   WBC 16.5 (*) 4.0 - 10.5 K/uL   RBC 5.72  4.22 - 5.81 MIL/uL    Hemoglobin 17.9 (*) 13.0 - 17.0 g/dL   HCT 16.1  09.6 - 04.5 %   MCV 87.1  78.0 - 100.0 fL   MCH 31.3  26.0 - 34.0 pg   MCHC 35.9  30.0 - 36.0 g/dL   RDW 40.9  81.1 - 91.4 %   Platelets 274  150 - 400 K/uL   Neutrophils Relative 73  43 - 77 %   Neutro Abs 12.0 (*) 1.7 - 7.7 K/uL   Lymphocytes Relative 15  12 - 46 %   Lymphs Abs 2.5  0.7 - 4.0 K/uL   Monocytes Relative 7  3 - 12 %   Monocytes Absolute 1.1 (*) 0.1 - 1.0 K/uL   Eosinophils Relative 5  0 - 5 %   Eosinophils Absolute 0.9 (*) 0.0 - 0.7 K/uL   Basophils Relative 0  0 - 1 %   Basophils Absolute 0.1  0.0 - 0.1 K/uL  COMPREHENSIVE METABOLIC PANEL      Component Value Range   Sodium 140  135 - 145 mEq/L   Potassium 3.9  3.5 - 5.1 mEq/L   Chloride 106  96 - 112 mEq/L   CO2 22  19 - 32 mEq/L   Glucose, Bld 107 (*) 70 - 99 mg/dL   BUN 13  6 - 23 mg/dL   Creatinine, Ser 1.61  0.50 - 1.35 mg/dL   Calcium 9.9  8.4 - 09.6 mg/dL   Total Protein 8.7 (*) 6.0 - 8.3 g/dL   Albumin 5.1  3.5 - 5.2 g/dL   AST 28  0 - 37 U/L   ALT 42  0 - 53 U/L   Alkaline Phosphatase 94  39 - 117 U/L   Total Bilirubin 0.5  0.3 - 1.2 mg/dL   GFR calc non Af Amer 84 (*) >90 mL/min   GFR calc Af Amer >90  >90 mL/min  URINE MICROSCOPIC-ADD ON      Component Value Range   RBC / HPF TOO NUMEROUS TO COUNT  <3 RBC/hpf   Crystals CA OXALATE CRYSTALS (*) NEGATIVE   Urine-Other MUCOUS PRESENT     Ct Abdomen Pelvis Wo Contrast  05/24/2012  *RADIOLOGY REPORT*  Clinical Data: Abdominal pain, vomiting.  CT ABDOMEN AND PELVIS WITHOUT CONTRAST  Technique:  Multidetector CT imaging of the abdomen and pelvis was performed following the standard protocol without intravenous contrast.  Comparison: None.  Findings: Limited images through the lung bases demonstrate no significant appreciable abnormality. The heart size is within normal limits. No pleural or pericardial effusion.  Organ abnormality/lesion detection is limited in the absence of intravenous contrast. Within  this limitation, there is a subcentimeter hypodensity within the left hepatic lobe. Otherwise homogeneous hepatic and splenic attenuation.  Unremarkable biliary system, pancreas, adrenal glands.  Unremarkable right kidney.  The left kidney is edematous with mild hydroureteronephrosis to the level of a 3.5 mm distal left ureteral stone.  No bowel obstruction.  No CT evidence for colitis. Appendix measures upper normal at 8 mm however no periappendiceal inflammation.  No lymphadenopathy.  Normal caliber vasculature.  Partially decompressed bladder.  No acute osseous finding.  IMPRESSION: Mild left hydroureteronephrosis to the level of a 3.5 mm distal left ureteral stone.  Appendix is prominent, measuring 8 mm in diameter.  There is no periappendiceal inflammation.  Correlate clinically if concern for early/mild appendicitis.  Original Report Authenticated By: Waneta Martins, M.D.   Recheck after IVFs and zofran, toradol, is pain free and no tenderness or acute ABD on exam.   1:30 AM d/w [facility] on call DR Dwain Sarna as above, he reviewed CT, agrees with my impression/ doubts PT has appendicitis. no indication for admit at this time.    Abdominal pain with kidney stone as above. Exam presentation does not suggest early appendicitis. Patient is a reasonable historian and states understanding strict discharge and followup instructions as well as strict return precautions. Prescription for pain medications and antiemetics provided with urology referral. Stable for discharge home MDM   Nursing notes reviewed. Vital signs reviewed. Labs and imaging as above. CT scan reviewed. General surgery consult. UA reviewed as above. Improved condition and stable for discharge home.        Sunnie Nielsen, MD 05/24/12 0158    Date: 06/18/2012  Rate: 47  Rhythm: sinus bradycardia  QRS Axis: right  Intervals: normal  ST/T Wave abnormalities: nonspecific ST/T changes  Conduction Disutrbances:none  Narrative  Interpretation:   Old EKG Reviewed: none available    Sunnie Nielsen, MD 06/18/12 2304

## 2012-05-23 NOTE — ED Notes (Signed)
Pt lives in group home - caregiver at bedside. Pt and caregiver report pt has not been feeling well today, later began to have diffuse abd pain w/ x3-4 episodes of diarrhea as well. While ambulating to room pt vomiting large amts. Pt denies any known fever. Attempted IV access x2 - unable to obtain IV access. Pt given ODT zofran 4mg  per protocol for n/v. Pt A&Ox4 - in no acute distress.

## 2012-05-23 NOTE — ED Notes (Signed)
abd pain since 1700 today with diarrhea

## 2012-05-23 NOTE — ED Notes (Signed)
No bm for days he cannot remember when his last one was

## 2012-05-24 MED ORDER — HYDROCODONE-ACETAMINOPHEN 5-500 MG PO TABS: 2.0000 | ORAL_TABLET | Freq: Four times a day (QID) | ORAL | Status: AC | PRN

## 2012-05-24 MED ORDER — KETOROLAC TROMETHAMINE 30 MG/ML IJ SOLN
30.0000 mg | Freq: Once | INTRAMUSCULAR | Status: AC
  Administered 2012-05-24: 30 mg via INTRAVENOUS
  Filled 2012-05-24: qty 1

## 2012-05-24 MED ORDER — METOCLOPRAMIDE HCL 5 MG/ML IJ SOLN
10.0000 mg | Freq: Once | INTRAMUSCULAR | Status: AC
  Administered 2012-05-24: 10 mg via INTRAVENOUS
  Filled 2012-05-24: qty 2

## 2012-05-24 MED ORDER — ONDANSETRON HCL 4 MG PO TABS: 4.0000 mg | ORAL_TABLET | Freq: Four times a day (QID) | ORAL | Status: AC

## 2012-05-24 NOTE — ED Notes (Signed)
Rx given x2 Pt ambulating independently w/ steady gait on d/c in no acute distress, A&Ox4. D/c instructions reviewed w/ pt and caregiver - pt and caregiver deny any further questions or concerns at present.

## 2012-05-25 LAB — URINE CULTURE: Colony Count: 6000

## 2012-05-27 ENCOUNTER — Telehealth: Payer: Self-pay | Admitting: Family Medicine

## 2012-05-27 NOTE — Telephone Encounter (Signed)
Caregiver is calling to report that the patient was taken to the ER for stomach pain on 05/23/12.  He was diagnosed with a Kidney Stone and was prescribed medication for nausea and pain med.  The MD at that time said that the stone was close to passing, Yesterday his pain was getting really bad so the Caregiver gave him some pain meds and he was able to sleep through the night, but this morning he was in so much pain that he wasn't able to get out of bed for school.  As of this morning, he has not passed the stone.  The Caregiver would like to speak to Dr. Adriana Simas further about this as well.

## 2012-05-29 ENCOUNTER — Telehealth: Payer: Self-pay | Admitting: Family Medicine

## 2012-05-29 NOTE — Telephone Encounter (Signed)
I spoke with caregiver about Donald Novak kidney stone and pain.  I informed her that the treatment for kidney stones is primarily supportive with pain meds and aggressive hydration.  Advised her that patient should pass stone; if he continues to have severe pain without passing the stone in approximately 1 week he should seek medical attention.

## 2012-06-01 ENCOUNTER — Telehealth: Payer: Self-pay | Admitting: Family Medicine

## 2012-06-01 NOTE — Telephone Encounter (Signed)
[name] from the [facility] would like to speak to Dr. Adriana Simas about Donald Novak' Kidney Stone.  The patient left for his visit with family on Thursday and there is an Encounter where Dr. Adriana Simas spoke with a woman, but he wants to speak to Dr. Adriana Simas.

## 2012-06-02 ENCOUNTER — Telehealth: Payer: Self-pay | Admitting: Family Medicine

## 2012-06-02 NOTE — Telephone Encounter (Signed)
Spoke with Mr. Donald Novak the patients caregiver.  Encouraged good hydration and pain management.  Told caregiver if pain continues and is uncontrolled (or patient runs out of pain meds) to seek medical attention either at the clinic or in the ED.

## 2012-08-28 ENCOUNTER — Ambulatory Visit (INDEPENDENT_AMBULATORY_CARE_PROVIDER_SITE_OTHER): Payer: Medicaid Other | Admitting: Family Medicine

## 2012-08-28 ENCOUNTER — Encounter: Payer: Self-pay | Admitting: Family Medicine

## 2012-08-28 VITALS — BP 120/76 | HR 102 | Ht 63.0 in | Wt 137.2 lb

## 2012-08-28 DIAGNOSIS — Z3009 Encounter for other general counseling and advice on contraception: Secondary | ICD-10-CM

## 2012-08-28 DIAGNOSIS — F209 Schizophrenia, unspecified: Secondary | ICD-10-CM

## 2012-08-28 DIAGNOSIS — G40909 Epilepsy, unspecified, not intractable, without status epilepticus: Secondary | ICD-10-CM

## 2012-08-28 NOTE — Assessment & Plan Note (Signed)
Stable on Latuda.  Patient doing well.

## 2012-08-28 NOTE — Patient Instructions (Addendum)
It was nice seeing you again today.  Be sure to follow up with your neurologist regarding your recent seizure activity and keppra level.  I'm glad to hear school is going well.  I'll see you back in 6 months - 1 year.

## 2012-08-28 NOTE — Assessment & Plan Note (Signed)
Discussed safe sex and condom use with patient.

## 2012-08-28 NOTE — Progress Notes (Signed)
Subjective:     Patient ID: Donald Novak, male   DOB: 12-Mar-1986, 26 y.o.   MRN: [id number]  HPI Donald Novak is a 26 year old gentlemen here today for followup.  He is accompanied by his caregiver, [name].  1) Schizophrenia - Stable on Latuda. - No reports of hallucinations or behavioral changes.   - He is doing well and is attending community college.  He states school is going well.  2) Seizure disorder - Patient has had recent seizure on 10/4 and 10/21. - Keppra increased to 3000 mg daily, per Neurology. - Caregiver informed me that patient has recent had several family visits and likely did not take his medication during that time.   Review of Systems See HPI    Objective:   Physical Exam General: well appearing, NAD. Heart: RRR. No murmurs, rubs, or gallops. Lungs: CTAB. Abdomen: soft, nontender, nondistended. Psych: conversant, pleasant.  Normal speech and thought.  Flattened affect noted.    Assessment:         Plan:

## 2012-08-28 NOTE — Assessment & Plan Note (Signed)
Managed by Neurology, [facility] Currently on Keppra (increased to 3000 mg daily) and Zonisamide. Defer management to Neurology.  Patient has appointment later this month.

## 2012-09-30 ENCOUNTER — Encounter: Payer: Self-pay | Admitting: Family Medicine

## 2013-01-12 ENCOUNTER — Ambulatory Visit: Payer: Medicaid Other | Admitting: Family Medicine

## 2013-01-14 ENCOUNTER — Ambulatory Visit (INDEPENDENT_AMBULATORY_CARE_PROVIDER_SITE_OTHER): Payer: Medicaid Other | Admitting: Family Medicine

## 2013-01-14 ENCOUNTER — Encounter: Payer: Self-pay | Admitting: Family Medicine

## 2013-01-14 VITALS — BP 120/80 | HR 72 | Ht 63.0 in | Wt 136.9 lb

## 2013-01-14 DIAGNOSIS — F17200 Nicotine dependence, unspecified, uncomplicated: Secondary | ICD-10-CM

## 2013-01-14 DIAGNOSIS — G40909 Epilepsy, unspecified, not intractable, without status epilepticus: Secondary | ICD-10-CM

## 2013-01-14 DIAGNOSIS — G8110 Spastic hemiplegia affecting unspecified side: Secondary | ICD-10-CM | POA: Insufficient documentation

## 2013-01-14 DIAGNOSIS — Z8673 Personal history of transient ischemic attack (TIA), and cerebral infarction without residual deficits: Secondary | ICD-10-CM

## 2013-01-14 DIAGNOSIS — F209 Schizophrenia, unspecified: Secondary | ICD-10-CM

## 2013-01-14 DIAGNOSIS — Z7189 Other specified counseling: Secondary | ICD-10-CM

## 2013-01-14 DIAGNOSIS — Z716 Tobacco abuse counseling: Secondary | ICD-10-CM

## 2013-01-14 MED ORDER — NICOTINE POLACRILEX 2 MG MT GUM: 2.0000 mg | CHEWING_GUM | OROMUCOSAL | Status: DC | PRN

## 2013-01-14 NOTE — Assessment & Plan Note (Signed)
Discussed cessation options with patient. Patient also met with health coach today. Will start Nicotine gum today (2mg ).  Patient to use ~ 5 times a day as substitute for smoking.  Will continue for 6 weeks and reassess.  Will titrate down at that time.

## 2013-01-14 NOTE — Patient Instructions (Addendum)
It was good to see you today.  Continue to take all of your medications as prescribed.  In regards to smoking cessation, use the gum instead of smoking.  Use every 3-4 hours as needed (substitute for smoking) for 6 weeks.  Then start decreasing your use of the gum.    Please follow up with me in 6 weeks.

## 2013-01-14 NOTE — Progress Notes (Signed)
Subjective:     Patient ID: Donald Novak, male   DOB: September 04, 1986, 27 y.o.   MRN: [id number]  HPI Donald Novak presents for follow up today.  He resides in a group home. Caregiver, [name].  1) Seizure disorder - Followed closely by Neurology, Dr. Marcello Moores - Last seizure was 10/2012. - Currently doing well with no reported medication side effects  2) Smoking cessation - Discussed smoking cessation with patient today - He has recently been contemplating quitting. - He currently smokes ~ 5 cigarettes a day.  - Barriers to quitting: He reports that people give him cigarettes (no cost to him), habit/part of routine (smokes in the morning, after meals, etc), people around him smoke (including caregiver, [name]).  - When asked what would help him quit, he stated "nicorette gum"  Review of Systems No fevers.  No changes in mood or behavior. Normal appetite.     Objective:   Physical Exam General: well appearing, NAD. Heart: RRR. No murmurs, rubs, or gallops. Lungs: CTAB. Neuro: AO x 3. Right upper extremity hemiparesis and spasticity noted.  Muscle strength decrease to 4/5 in LUE.  Psych: flat affect noted.    Assessment:         Plan:

## 2013-04-21 ENCOUNTER — Ambulatory Visit (INDEPENDENT_AMBULATORY_CARE_PROVIDER_SITE_OTHER): Payer: Medicaid Other | Admitting: Family Medicine

## 2013-04-21 VITALS — BP 98/62 | HR 73 | Temp 98.2°F | Wt 137.3 lb

## 2013-04-21 DIAGNOSIS — G40909 Epilepsy, unspecified, not intractable, without status epilepticus: Secondary | ICD-10-CM

## 2013-04-21 NOTE — Progress Notes (Signed)
Subjective:     Patient ID: Donald Novak, male   DOB: [birth year], 27 y.o.   MRN: [id number]  HPI Mr. Levesque presents for follow up today. He resides in a group home - Caregiver, [name].  Caregiver needs FL2 paperwork signed.   Mr. Mozer has no complaints today.  1) Seizure disorder - Last reported seizure was in Jan. 2014 - Currently doing well on Keppra 3000 mg daily  2) History of TBI and Schizophrenia - Patient currently doing well.  Recent increase in Latuda to 120 mg by Psychiatry (Dr. Omelia Blackwater)  Review of Systems Patient denies any recent seizure or mood disturbance/issues.     Objective:   Physical Exam Filed Vitals:   04/21/13 1550  BP: 98/62  Pulse: 73  Temp: 98.2 F (36.8 C)   Exam: General: well appearing, NAD. Cardiovascular: RRR. No murmurs, rubs, or gallops. Respiratory: CTAB. No rales, rhonchi, or wheeze. Abdomen: soft, nontender, nondistended. Extremities: No LE edema. Neuro: R Upper extremity spasticity noted.   Psych: Flat affect     Assessment/Plan:     Seizure disorder - well controlled.  Continue Keppra per neurology Hx of TBI and Schizophrenia - Latuda increased as above. FL2 - Signed

## 2013-06-19 IMAGING — CT CT ABD-PELV W/O CM
2 of 4 series · 14 of 32 positions shown, 19 images · non-contrast
Comparison: None.

CLINICAL DATA: Abdominal pain, vomiting.

CT ABDOMEN AND PELVIS WITHOUT CONTRAST
TECHNIQUE: Multidetector CT imaging of the abdomen and pelvis was
performed following the standard protocol without intravenous
contrast.

[Series 2: routine abdomen · axial · 0.70mm/px · z∈[-388,-38]mm · 8 of 90 slices shown, 13 images]
[im 10/90  soft-tissue]
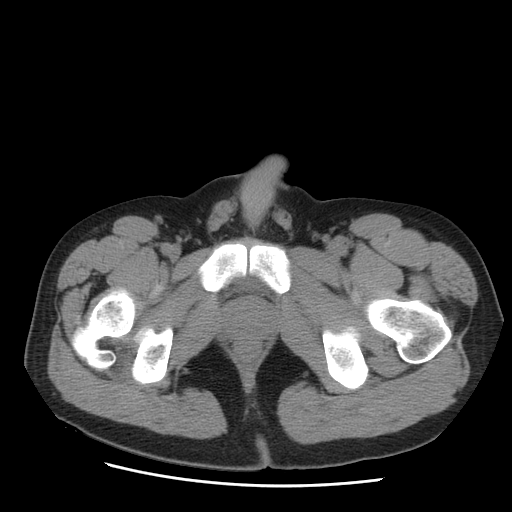
[im 10/90  bone]
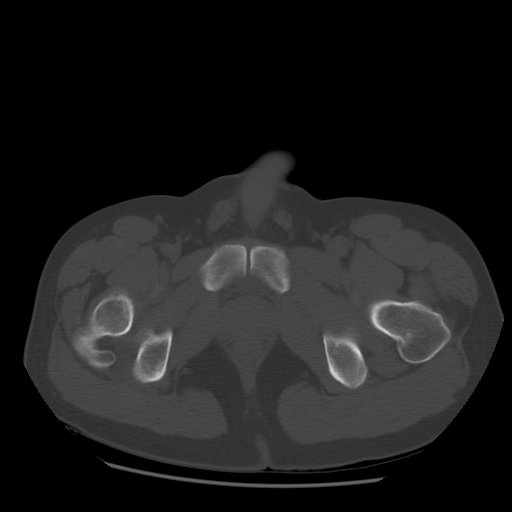
[im 20/90  soft-tissue]
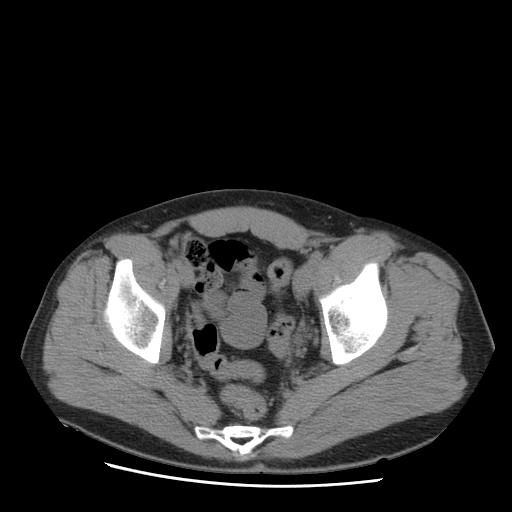
[im 30/90  soft-tissue]
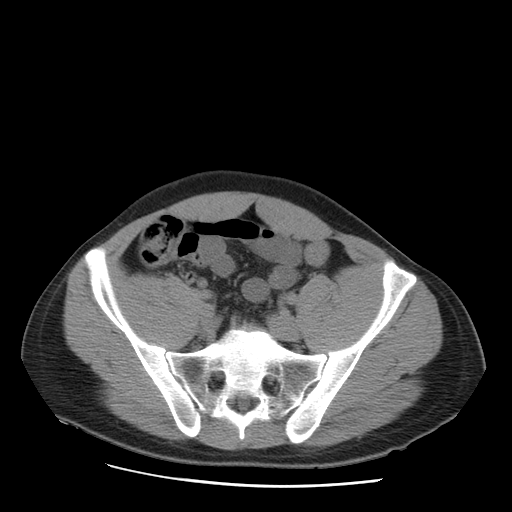
[im 40/90  soft-tissue]
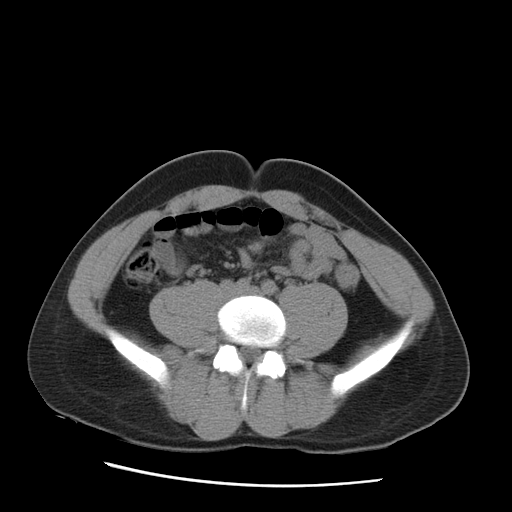
[im 50/90  soft-tissue]
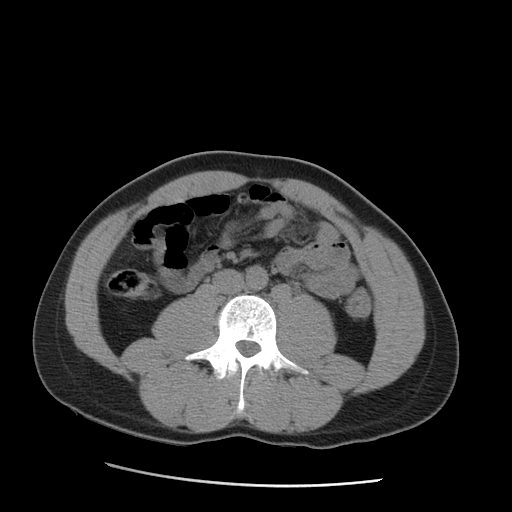
[im 50/90  lung]
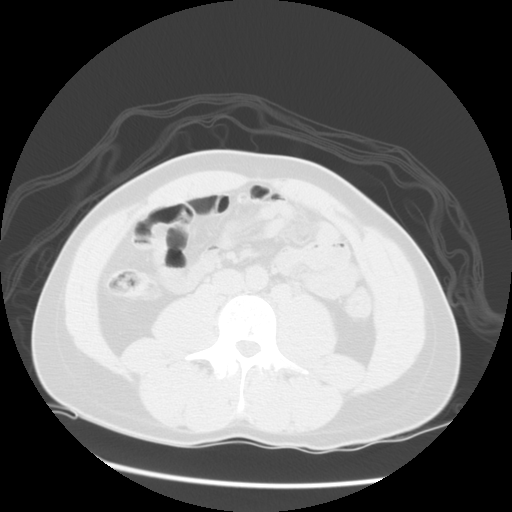
[im 60/90  soft-tissue]
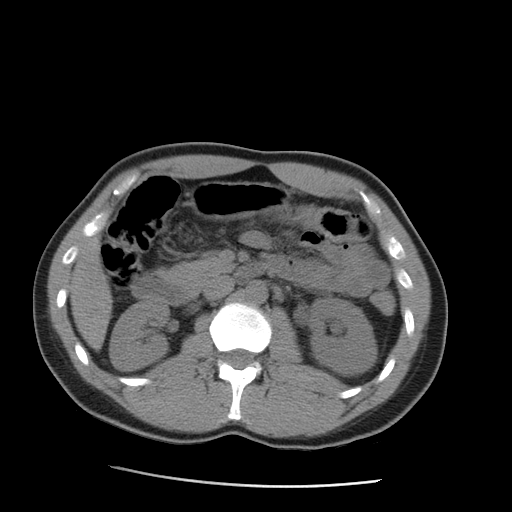
[im 60/90  lung]
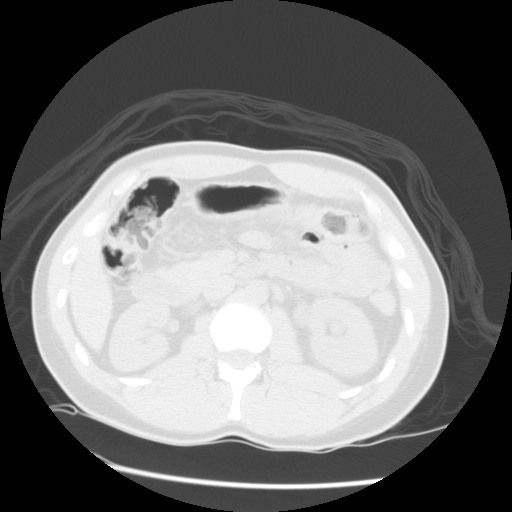
[im 70/90  soft-tissue]
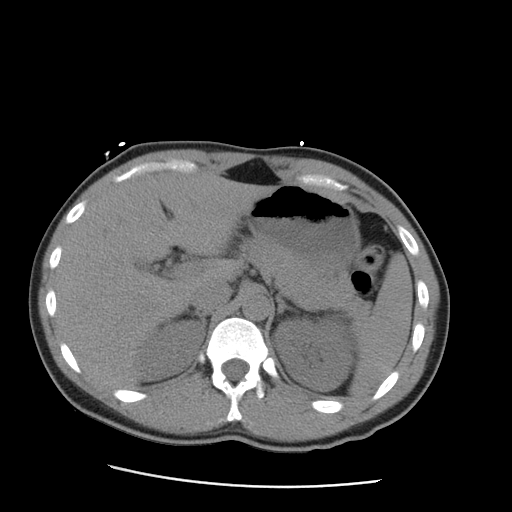
[im 70/90  lung]
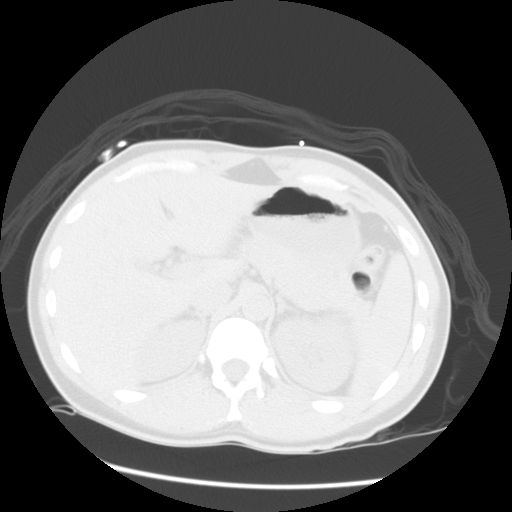
[im 80/90  soft-tissue]
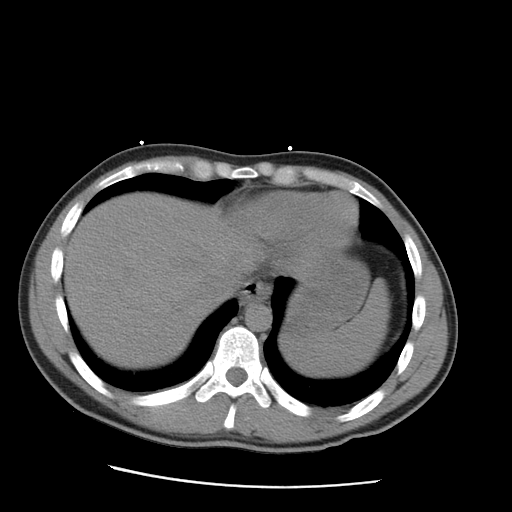
[im 80/90  lung]
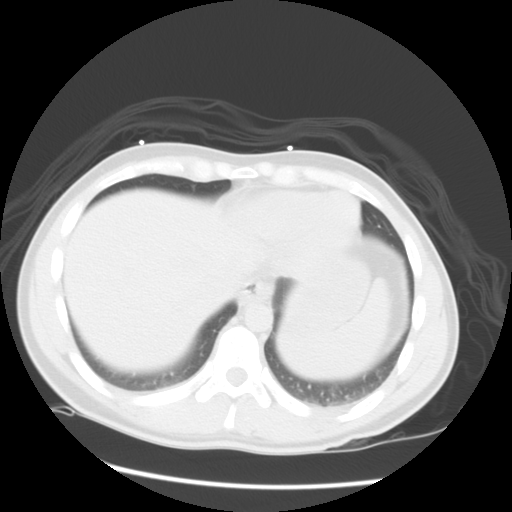

[Series 401: sagitals · sagittal · 0.89mm/px · 6 of 93 slices shown]
[im 10/93  soft-tissue]
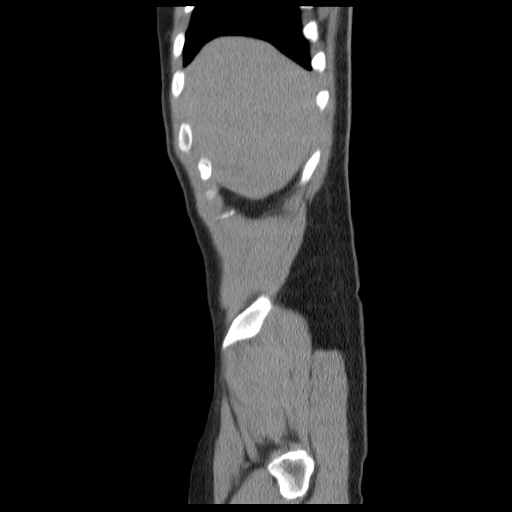
[im 19/93  soft-tissue]
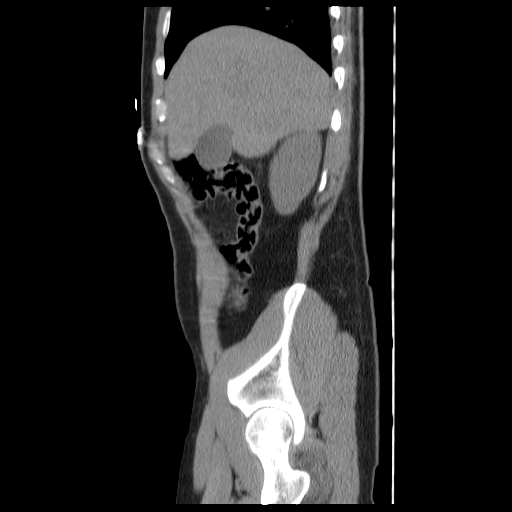
[im 28/93  soft-tissue]
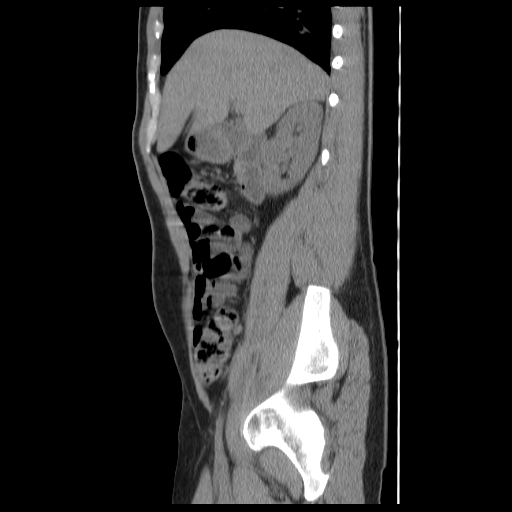
[im 37/93  soft-tissue]
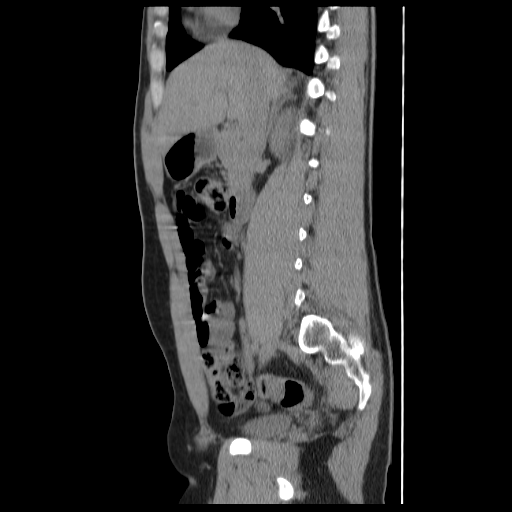
[im 56/93  soft-tissue]
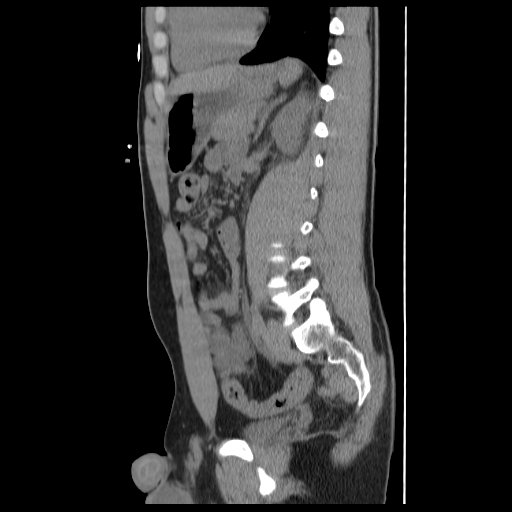
[im 65/93  soft-tissue]
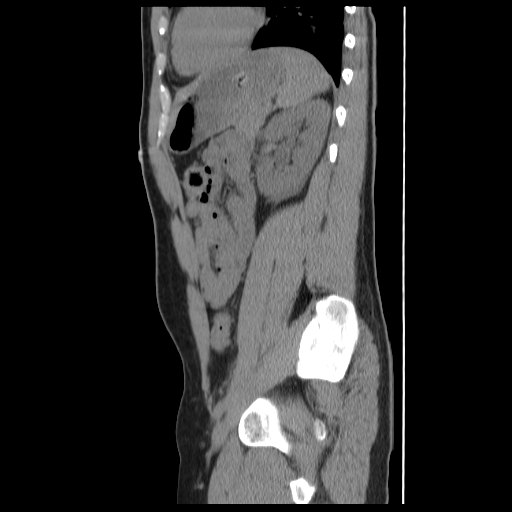

[14 of 32 positions shown; findings below may reference images not displayed]

FINDINGS: Limited images through the lung bases demonstrate no
significant appreciable abnormality. The heart size is within
normal limits. No pleural or pericardial effusion.

Organ abnormality/lesion detection is limited in the absence of
intravenous contrast. Within this limitation, there is a
subcentimeter hypodensity within the left hepatic lobe. Otherwise
homogeneous hepatic and splenic attenuation.  Unremarkable biliary
system, pancreas, adrenal glands.

Unremarkable right kidney.

The left kidney is edematous with mild hydroureteronephrosis to the
level of a 3.5 mm distal left ureteral stone.

No bowel obstruction.  No CT evidence for colitis. Appendix
measures upper normal at 8 mm however no periappendiceal
inflammation.  No lymphadenopathy.

Normal caliber vasculature.

Partially decompressed bladder.

No acute osseous finding.
IMPRESSION: Mild left hydroureteronephrosis to the level of a 3.5 mm distal
left ureteral stone.

Appendix is prominent, measuring 8 mm in diameter.  There is no
periappendiceal inflammation.  Correlate clinically if concern for
early/mild appendicitis.

## 2013-09-16 ENCOUNTER — Ambulatory Visit (INDEPENDENT_AMBULATORY_CARE_PROVIDER_SITE_OTHER): Payer: Medicaid Other | Admitting: Family Medicine

## 2013-09-16 ENCOUNTER — Other Ambulatory Visit (HOSPITAL_COMMUNITY)
Admission: RE | Admit: 2013-09-16 | Discharge: 2013-09-16 | Disposition: A | Discharge status: Home / Self Care | Payer: 59 | Source: Ambulatory Visit | Attending: Family Medicine | Admitting: Family Medicine

## 2013-09-16 ENCOUNTER — Encounter: Payer: Self-pay | Admitting: Family Medicine

## 2013-09-16 VITALS — BP 114/69 | HR 79 | Temp 98.2°F | Ht 63.0 in | Wt 147.8 lb

## 2013-09-16 DIAGNOSIS — F209 Schizophrenia, unspecified: Secondary | ICD-10-CM

## 2013-09-16 DIAGNOSIS — Z113 Encounter for screening for infections with a predominantly sexual mode of transmission: Secondary | ICD-10-CM

## 2013-09-16 DIAGNOSIS — Z202 Contact with and (suspected) exposure to infections with a predominantly sexual mode of transmission: Secondary | ICD-10-CM

## 2013-09-16 DIAGNOSIS — Z79899 Other long term (current) drug therapy: Secondary | ICD-10-CM

## 2013-09-16 DIAGNOSIS — Z2089 Contact with and (suspected) exposure to other communicable diseases: Secondary | ICD-10-CM

## 2013-09-16 DIAGNOSIS — G40909 Epilepsy, unspecified, not intractable, without status epilepticus: Secondary | ICD-10-CM

## 2013-09-16 NOTE — Progress Notes (Signed)
Subjective:     Patient ID: Donald Novak, male   DOB: Mar 16, 1986, 27 y.o.   MRN: [id number]  HPI Donald Novak presents for follow up today.  He resides in a group home - Caregiver, [name].  Issues for follow up are below:  1) Seizure disorder  - Last seizure was in January of 2014. - Patient has Vagal nerve stimulator which was recently increase by neurology. - Currently doing well on Keppra 3000 mg daily. - Patient will continue to be followed by neurology  2) History of TBI and Schizophrenia  - Patient followed by Psychiatry - Patient doing well on Latuda  3) STI screening - Patient sexually active with 2-3 partners - Patient practices safe sex but would like to be screen for STD's today.  Review of Systems Patient denies chest pain, SOB, abdominal pain, constipation/diarrhea.  No recent seizures (as above).     Objective:   Physical Exam Filed Vitals:   09/16/13 1526  BP: 114/69  Pulse: 79  Temp: 98.2 F (36.8 C)   Exam: General: well appearing, NAD. HEENT: NCAT. No pharyngeal erythema or tonsillar exudate. Cardiovascular: RRR. No murmurs, rubs, or gallops. Respiratory: CTAB. No rales, rhonchi, or wheeze. Abdomen: soft, nontender, nondistended. Neuro: right sided weakness and upper extremity muscle atrophy noted.   Psych: normal mood and affect.     Assessment/Plan:      See Problem List

## 2013-09-16 NOTE — Patient Instructions (Signed)
It was nice to see you today.  You're doing great.  You will receive a letter or a phone call with your lab results.  Continue your medications as prescribed.

## 2013-09-17 ENCOUNTER — Encounter: Payer: Self-pay | Admitting: Family Medicine

## 2013-09-19 DIAGNOSIS — Z79899 Other long term (current) drug therapy: Secondary | ICD-10-CM | POA: Insufficient documentation

## 2013-09-19 NOTE — Assessment & Plan Note (Signed)
Patient is doing well.  Patient to continue to follow closely with neurology.

## 2013-09-19 NOTE — Assessment & Plan Note (Signed)
HIV, RPR, Urine GC/Chlamydia obtained today.

## 2013-09-19 NOTE — Assessment & Plan Note (Signed)
Stable on Latuda. Unclear if psych is monitoring labs, so will obtain lipid panel and A1C (recommended monitoring.)

## 2013-09-21 ENCOUNTER — Encounter: Payer: Self-pay | Admitting: Family Medicine

## 2014-03-29 ENCOUNTER — Ambulatory Visit (INDEPENDENT_AMBULATORY_CARE_PROVIDER_SITE_OTHER): Payer: Medicaid Other | Admitting: Family Medicine

## 2014-03-29 ENCOUNTER — Other Ambulatory Visit (HOSPITAL_COMMUNITY)
Admission: RE | Admit: 2014-03-29 | Discharge: 2014-03-29 | Disposition: A | Discharge status: Home / Self Care | Payer: PRIVATE HEALTH INSURANCE | Source: Ambulatory Visit | Attending: Family Medicine | Admitting: Family Medicine

## 2014-03-29 ENCOUNTER — Encounter: Payer: Self-pay | Admitting: Family Medicine

## 2014-03-29 VITALS — BP 115/60 | HR 71 | Temp 98.2°F | Ht 63.0 in | Wt 144.0 lb

## 2014-03-29 DIAGNOSIS — F209 Schizophrenia, unspecified: Secondary | ICD-10-CM

## 2014-03-29 DIAGNOSIS — Z113 Encounter for screening for infections with a predominantly sexual mode of transmission: Secondary | ICD-10-CM

## 2014-03-29 DIAGNOSIS — Z79899 Other long term (current) drug therapy: Secondary | ICD-10-CM

## 2014-03-29 LAB — CBC
HCT: 45.8 % (ref 39.0–52.0)
Hemoglobin: 16.2 g/dL (ref 13.0–17.0)
MCH: 30.5 pg (ref 26.0–34.0)
MCHC: 35.4 g/dL (ref 30.0–36.0)
MCV: 86.1 fL (ref 78.0–100.0)
PLATELETS: 297 10*3/uL (ref 150–400)
RBC: 5.32 MIL/uL (ref 4.22–5.81)
RDW: 13.3 % (ref 11.5–15.5)
WBC: 7 10*3/uL (ref 4.0–10.5)

## 2014-03-29 LAB — COMPLETE METABOLIC PANEL WITH GFR
ALT: 43 U/L (ref 0–53)
AST: 22 U/L (ref 0–37)
Albumin: 4.5 g/dL (ref 3.5–5.2)
Alkaline Phosphatase: 88 U/L (ref 39–117)
BUN: 13 mg/dL (ref 6–23)
CALCIUM: 9.6 mg/dL (ref 8.4–10.5)
CHLORIDE: 108 meq/L (ref 96–112)
CO2: 23 meq/L (ref 19–32)
Creat: 1.19 mg/dL (ref 0.50–1.35)
GFR, Est Non African American: 84 mL/min
Glucose, Bld: 91 mg/dL (ref 70–99)
POTASSIUM: 3.8 meq/L (ref 3.5–5.3)
SODIUM: 138 meq/L (ref 135–145)
TOTAL PROTEIN: 7.3 g/dL (ref 6.0–8.3)
Total Bilirubin: 0.5 mg/dL (ref 0.2–1.2)

## 2014-03-29 LAB — LIPID PANEL
Cholesterol: 190 mg/dL (ref 0–200)
HDL: 27 mg/dL — ABNORMAL LOW (ref >39)
LDL CALC: 113 mg/dL — AB (ref 0–99)
Total CHOL/HDL Ratio: 7 Ratio
Triglycerides: 251 mg/dL — ABNORMAL HIGH (ref <150)
VLDL: 50 mg/dL — AB (ref 0–40)

## 2014-03-29 NOTE — Patient Instructions (Signed)
Your doing well.  You will receive a letter regarding your lab results.  Follow up at least annually.

## 2014-03-30 LAB — HIV ANTIBODY (ROUTINE TESTING W REFLEX): HIV: NONREACTIVE

## 2014-03-30 LAB — RPR

## 2014-03-30 NOTE — Assessment & Plan Note (Addendum)
Stable. Checking CBC, CMP, Lipid panel today as patient is on Latuda and it is unclear if this is being checked by psych.

## 2014-03-30 NOTE — Progress Notes (Signed)
   Subjective:    Patient ID: Donald Novak, male    DOB: 1986/08/22, 28 y.o.   MRN: [id number]  HPI 28 year old male with history of schizophrenia, TBI, seizure disorder, and spastic hemiplegia presents for follow up.  1) Schizophrenia - Followed Dr. Omelia BlackwaterHeaden, Psychiatry - Patient and caregiver report that he is doing well - Compliant with Latuda  2) Seizure disorder - No recent seizure activity - Neurology has made some recent changes to his Zonegran (updated in Epic). - No changes to keppra. - Patient reports that he is doing well.   Review of Systems Per HPI    Objective:   Physical Exam Filed Vitals:   03/29/14 1550  BP: 115/60  Pulse: 71  Temp: 98.2 F (36.8 C)   Exam: General: well appearing, NAD.  Cardiovascular: RRR. No murmurs, rubs, or gallops.  Respiratory: CTAB. No rales, rhonchi, or wheeze.  Abdomen: soft, nontender, nondistended.  Neuro: right sided weakness and upper extremity muscle atrophy noted.  Psych: flat affect noted.      Assessment & Plan:  See Problem List

## 2014-03-30 NOTE — Assessment & Plan Note (Signed)
Screening today as patient requested.  He endorses multiple sexual partners but endorses use of condoms.

## 2014-04-07 ENCOUNTER — Telehealth: Payer: Self-pay | Admitting: Family Medicine

## 2014-04-07 NOTE — Telephone Encounter (Signed)
Did not receive the lab results for pt

## 2014-04-07 NOTE — Telephone Encounter (Signed)
Labs normal other than elevated cholesterol and triglycerides.

## 2014-04-08 NOTE — Telephone Encounter (Signed)
Spoke with Casimiro NeedleMichael and gave message, also copy of labs was sent out to him per his request

## 2014-05-05 ENCOUNTER — Encounter: Payer: Self-pay | Admitting: Family Medicine

## 2014-05-05 NOTE — Progress Notes (Unsigned)
Pt's care provider dropped off forms to be filled out regarding FL2 yearly update pertaining to long term care services and need back as soon as possible was told have to allow up to 14 days.  Contact number [phone] °

## 2014-09-19 ENCOUNTER — Ambulatory Visit: Payer: Medicaid Other | Admitting: Family Medicine

## 2014-11-16 ENCOUNTER — Ambulatory Visit: Payer: PRIVATE HEALTH INSURANCE | Admitting: Family Medicine

## 2014-11-17 ENCOUNTER — Ambulatory Visit (INDEPENDENT_AMBULATORY_CARE_PROVIDER_SITE_OTHER): Payer: PRIVATE HEALTH INSURANCE | Admitting: Family Medicine

## 2014-11-17 VITALS — BP 88/50 | HR 72 | Temp 98.2°F | Ht 63.0 in | Wt 136.8 lb

## 2014-11-17 DIAGNOSIS — G40909 Epilepsy, unspecified, not intractable, without status epilepticus: Secondary | ICD-10-CM

## 2014-11-17 DIAGNOSIS — F209 Schizophrenia, unspecified: Secondary | ICD-10-CM | POA: Diagnosis not present

## 2014-11-17 DIAGNOSIS — I950 Idiopathic hypotension: Secondary | ICD-10-CM | POA: Diagnosis not present

## 2014-11-17 DIAGNOSIS — I959 Hypotension, unspecified: Secondary | ICD-10-CM | POA: Insufficient documentation

## 2014-11-17 NOTE — Assessment & Plan Note (Signed)
Well-controlled at this time. Patient to follow closely with psychiatry.

## 2014-11-17 NOTE — Patient Instructions (Signed)
Donald Novak is doing well.  Please continue all his medications as prescribed.  Follow up with me annually.  Take care  Dr. Adriana Simasook

## 2014-11-17 NOTE — Assessment & Plan Note (Signed)
Hypotension noted today. Patient was asymptomatic.

## 2014-11-17 NOTE — Assessment & Plan Note (Signed)
Stable and doing well at this time.   

## 2014-11-17 NOTE — Progress Notes (Signed)
   Subjective:    Patient ID: Donald Novak, male    DOB: May 30, 1986, 29 y.o.   MRN: [id number]  HPI 29 year old male with history of schizophrenia, TBI, seizure disorder, and spastic hemiplegia presents for follow up.  1) Schizophrenia - Followed Dr. Omelia BlackwaterHeaden, Psychiatry. - Patient and caregiver report that he is doing well. - Compliant with Latuda.  2) Seizure disorder - No recent seizure activity; Last seizure was 10/2012.  - He has had replacement of his VNS in February and reports he is doing well.  - He continues to be compliant with Keppra 3000 mg QHS.  Review of Systems Per HPI    Objective:   Physical Exam Filed Vitals:   11/17/14 1508  BP: 88/50  Pulse: 72  Temp: 98.2 F (36.8 C)  General: well appearing, NAD.  Cardiovascular: RRR. No murmurs, rubs, or gallops.  Respiratory: CTAB. No rales, rhonchi, or wheeze.  Abdomen: soft, nontender, nondistended.  Neuro: right sided weakness and upper extremity muscle atrophy/spasm noted. Psych: flat affect noted.     Assessment & Plan:  See Problem List

## 2015-01-10 ENCOUNTER — Encounter: Payer: Self-pay | Admitting: Clinical

## 2015-01-10 NOTE — Progress Notes (Signed)
CSW has faxed completed PCS form to [facility]. CSW unable to fax to [facility] as fax was returned.  Donald BoughNorma [name], MSW, LCSW [phone]

## 2015-04-28 ENCOUNTER — Encounter: Payer: Self-pay | Admitting: Family Medicine

## 2015-04-28 ENCOUNTER — Ambulatory Visit (INDEPENDENT_AMBULATORY_CARE_PROVIDER_SITE_OTHER): Payer: PRIVATE HEALTH INSURANCE | Admitting: Family Medicine

## 2015-04-28 VITALS — BP 116/59 | HR 67 | Temp 98.3°F | Ht 63.0 in | Wt 137.8 lb

## 2015-04-28 DIAGNOSIS — Z0000 Encounter for general adult medical examination without abnormal findings: Secondary | ICD-10-CM | POA: Diagnosis not present

## 2015-04-28 DIAGNOSIS — Z7189 Other specified counseling: Secondary | ICD-10-CM

## 2015-04-28 LAB — COMPREHENSIVE METABOLIC PANEL
ALBUMIN: 4.4 g/dL (ref 3.5–5.2)
ALT: 22 U/L (ref 0–53)
AST: 19 U/L (ref 0–37)
Alkaline Phosphatase: 76 U/L (ref 39–117)
BILIRUBIN TOTAL: 0.5 mg/dL (ref 0.2–1.2)
BUN: 14 mg/dL (ref 6–23)
CO2: 26 mEq/L (ref 19–32)
Calcium: 9.7 mg/dL (ref 8.4–10.5)
Chloride: 108 mEq/L (ref 96–112)
Creat: 1.22 mg/dL (ref 0.50–1.35)
GLUCOSE: 69 mg/dL — AB (ref 70–99)
Potassium: 4.4 mEq/L (ref 3.5–5.3)
SODIUM: 141 meq/L (ref 135–145)
TOTAL PROTEIN: 7.1 g/dL (ref 6.0–8.3)

## 2015-04-28 NOTE — Progress Notes (Addendum)
Subjective:     Patient ID: Donald BushyJustice Novak, male   DOB: 05/13/1986, 29 y.o.   MRN: [id number]  HPI Mr. Donald FlockVares is a 28yo male presenting today with Caregiver to have FL2 form filled out. - History of Traumatic Brain Injury, Seizure Disorder, Hypotension, Sastic Hemiplegia affecting Dominant Side, Schizophrenia. - Medications reviewed and updated in Epic  - No acute complaints today. All above concerns stable - Wishes for me to obtain contact information from Psychiatrist and Neurologist. Will send me contact information.  Review of Systems  Respiratory: Negative for shortness of breath.   Cardiovascular: Negative for chest pain.  Gastrointestinal: Negative for nausea, diarrhea and constipation.       Objective:   Physical Exam  Constitutional: He is oriented to person, place, and time. He appears well-developed and well-nourished. No distress.  HENT:  Head: Normocephalic and atraumatic.  Right Ear: External ear normal.  Left Ear: External ear normal.  Mouth/Throat: No oropharyngeal exudate.  Cardiovascular: Normal rate and regular rhythm.  Exam reveals no gallop and no friction rub.   No murmur heard. Pulmonary/Chest: Effort normal. No respiratory distress. He has no wheezes. He has no rales.  Abdominal: Soft. He exhibits no distension. There is no tenderness.  Musculoskeletal: He exhibits no edema.  Neurological: He is alert and oriented to person, place, and time.  Skin: No rash noted.      Assessment:     Please refer to Problem List for Assessment.     Plan:     Please refer to Problem List for Plan.

## 2015-04-28 NOTE — Patient Instructions (Addendum)
Thank you so much for coming to visit me today!  I signed your FL2 form and returned it to you. We will check your CMP today to look at your electrolytes, kidneys, and liver. I will let you know the results of this test!  Please let me know if there is anything else I can do for you! Dr. Caroleen Hammanumley

## 2015-05-01 DIAGNOSIS — Z0000 Encounter for general adult medical examination without abnormal findings: Secondary | ICD-10-CM | POA: Insufficient documentation

## 2015-05-01 NOTE — Assessment & Plan Note (Signed)
-   No refills needed. Medications reviewed and updated in Epic. - FL2 form reviewed and signed. Returned to Engineer, structuralCaregiver. - Will check CM today to evaluate liver, kidneys, and electrolytes due to multiple chronic medications. - Caregiver to contact with Psych and Neuro contact info - Return as needed

## 2015-05-01 NOTE — Addendum Note (Signed)
Addended by: Araceli BoucheUMLEY, [place] on: 05/01/2015 08:31 PM   Modules accepted: Kipp BroodSmartSet
